# Patient Record
Sex: Male | Born: 1980 | Race: White | Hispanic: No | Marital: Single | Smoking: Current some day smoker
Health system: Southern US, Community
[De-identification: ages and names within clinical notes are randomized; demographics above are authoritative.]

## PROBLEM LIST (undated history)

## (undated) DIAGNOSIS — F32A Depression, unspecified: Secondary | ICD-10-CM

## (undated) DIAGNOSIS — F329 Major depressive disorder, single episode, unspecified: Secondary | ICD-10-CM

---
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
## 2019-04-05 ENCOUNTER — Encounter (HOSPITAL_COMMUNITY): Payer: Self-pay

## 2019-04-05 ENCOUNTER — Observation Stay (HOSPITAL_COMMUNITY)
Admission: EM | Admit: 2019-04-05 | Discharge: 2019-04-06 | Disposition: A | Discharge status: Home / Self Care | Payer: BC Managed Care – PPO | Attending: Surgery | Admitting: Surgery

## 2019-04-05 ENCOUNTER — Observation Stay (HOSPITAL_COMMUNITY): Payer: BC Managed Care – PPO | Admitting: Anesthesiology

## 2019-04-05 ENCOUNTER — Encounter (HOSPITAL_COMMUNITY)
Admission: EM | Disposition: A | Discharge status: Home / Self Care | Payer: Self-pay | Source: Home / Self Care | Attending: Emergency Medicine

## 2019-04-05 ENCOUNTER — Emergency Department (HOSPITAL_COMMUNITY): Payer: BC Managed Care – PPO

## 2019-04-05 ENCOUNTER — Other Ambulatory Visit: Payer: Self-pay

## 2019-04-05 DIAGNOSIS — K429 Umbilical hernia without obstruction or gangrene: Secondary | ICD-10-CM | POA: Diagnosis not present

## 2019-04-05 DIAGNOSIS — Z23 Encounter for immunization: Secondary | ICD-10-CM | POA: Insufficient documentation

## 2019-04-05 DIAGNOSIS — F17200 Nicotine dependence, unspecified, uncomplicated: Secondary | ICD-10-CM | POA: Insufficient documentation

## 2019-04-05 DIAGNOSIS — K3589 Other acute appendicitis without perforation or gangrene: Secondary | ICD-10-CM | POA: Diagnosis present

## 2019-04-05 DIAGNOSIS — Z6835 Body mass index (BMI) 35.0-35.9, adult: Secondary | ICD-10-CM | POA: Diagnosis not present

## 2019-04-05 DIAGNOSIS — Z79899 Other long term (current) drug therapy: Secondary | ICD-10-CM | POA: Insufficient documentation

## 2019-04-05 DIAGNOSIS — K3580 Unspecified acute appendicitis: Secondary | ICD-10-CM | POA: Diagnosis not present

## 2019-04-05 DIAGNOSIS — N200 Calculus of kidney: Secondary | ICD-10-CM | POA: Insufficient documentation

## 2019-04-05 DIAGNOSIS — F419 Anxiety disorder, unspecified: Secondary | ICD-10-CM | POA: Insufficient documentation

## 2019-04-05 DIAGNOSIS — Z8719 Personal history of other diseases of the digestive system: Secondary | ICD-10-CM | POA: Diagnosis present

## 2019-04-05 HISTORY — PX: LAPAROSCOPIC APPENDECTOMY: SHX408

## 2019-04-05 LAB — URINALYSIS, ROUTINE W REFLEX MICROSCOPIC
Bilirubin Urine: NEGATIVE
Glucose, UA: 50 mg/dL — AB
Hgb urine dipstick: NEGATIVE
Ketones, ur: 20 mg/dL — AB
Leukocytes,Ua: NEGATIVE
Nitrite: NEGATIVE
Protein, ur: NEGATIVE mg/dL
Specific Gravity, Urine: 1.014 (ref 1.005–1.030)
pH: 5 (ref 5.0–8.0)

## 2019-04-05 LAB — COMPREHENSIVE METABOLIC PANEL
ALT: 17 U/L (ref 0–44)
AST: 13 U/L — ABNORMAL LOW (ref 15–41)
Albumin: 4.2 g/dL (ref 3.5–5.0)
Alkaline Phosphatase: 46 U/L (ref 38–126)
Anion gap: 11 (ref 5–15)
BUN: 10 mg/dL (ref 6–20)
CO2: 23 mmol/L (ref 22–32)
Calcium: 9.1 mg/dL (ref 8.9–10.3)
Chloride: 100 mmol/L (ref 98–111)
Creatinine, Ser: 0.95 mg/dL (ref 0.61–1.24)
GFR calc Af Amer: >60 mL/min (ref >60)
GFR calc non Af Amer: >60 mL/min (ref >60)
Glucose, Bld: 175 mg/dL — ABNORMAL HIGH (ref 70–99)
Potassium: 3.8 mmol/L (ref 3.5–5.1)
Sodium: 134 mmol/L — ABNORMAL LOW (ref 135–145)
Total Bilirubin: 1.1 mg/dL (ref 0.3–1.2)
Total Protein: 7.4 g/dL (ref 6.5–8.1)

## 2019-04-05 LAB — CBC
HCT: 45.4 % (ref 39.0–52.0)
Hemoglobin: 16.1 g/dL (ref 13.0–17.0)
MCH: 29.5 pg (ref 26.0–34.0)
MCHC: 35.5 g/dL (ref 30.0–36.0)
MCV: 83.3 fL (ref 80.0–100.0)
Platelets: 249 10*3/uL (ref 150–400)
RBC: 5.45 MIL/uL (ref 4.22–5.81)
RDW: 12.2 % (ref 11.5–15.5)
WBC: 18.9 10*3/uL — ABNORMAL HIGH (ref 4.0–10.5)
nRBC: 0 % (ref 0.0–0.2)

## 2019-04-05 LAB — LIPASE, BLOOD: Lipase: 22 U/L (ref 11–51)

## 2019-04-05 LAB — SARS CORONAVIRUS 2 BY RT PCR (HOSPITAL ORDER, PERFORMED IN ~~LOC~~ HOSPITAL LAB): SARS Coronavirus 2: NEGATIVE

## 2019-04-05 SURGERY — APPENDECTOMY, LAPAROSCOPIC
Anesthesia: General | Site: Abdomen

## 2019-04-05 MED ORDER — KCL IN DEXTROSE-NACL 20-5-0.45 MEQ/L-%-% IV SOLN
INTRAVENOUS | Status: DC
  Administered 2019-04-05 – 2019-04-06 (×2): via INTRAVENOUS
  Filled 2019-04-05 (×2): qty 1000

## 2019-04-05 MED ORDER — ONDANSETRON HCL 4 MG/2ML IJ SOLN
INTRAMUSCULAR | Status: AC
  Filled 2019-04-05: qty 2

## 2019-04-05 MED ORDER — ONDANSETRON HCL 4 MG/2ML IJ SOLN: 4.0000 mg | Freq: Four times a day (QID) | INTRAMUSCULAR | Status: DC | PRN

## 2019-04-05 MED ORDER — METRONIDAZOLE IN NACL 5-0.79 MG/ML-% IV SOLN
500.0000 mg | Freq: Three times a day (TID) | INTRAVENOUS | Status: DC
  Administered 2019-04-05 – 2019-04-06 (×3): 500 mg via INTRAVENOUS
  Filled 2019-04-05 (×3): qty 100

## 2019-04-05 MED ORDER — DEXAMETHASONE SODIUM PHOSPHATE 10 MG/ML IJ SOLN
INTRAMUSCULAR | Status: AC
  Filled 2019-04-05: qty 1

## 2019-04-05 MED ORDER — ROCURONIUM BROMIDE 10 MG/ML (PF) SYRINGE
PREFILLED_SYRINGE | INTRAVENOUS | Status: AC
  Filled 2019-04-05: qty 10

## 2019-04-05 MED ORDER — DICYCLOMINE HCL 10 MG PO CAPS: 20.0000 mg | ORAL_CAPSULE | Freq: Once | ORAL | Status: DC

## 2019-04-05 MED ORDER — PROPOFOL 10 MG/ML IV BOLUS
INTRAVENOUS | Status: AC
  Filled 2019-04-05: qty 20

## 2019-04-05 MED ORDER — ROCURONIUM BROMIDE 100 MG/10ML IV SOLN
INTRAVENOUS | Status: DC | PRN
  Administered 2019-04-05: 50 mg via INTRAVENOUS

## 2019-04-05 MED ORDER — PROPOFOL 10 MG/ML IV BOLUS
INTRAVENOUS | Status: DC | PRN
  Administered 2019-04-05: 300 mg via INTRAVENOUS

## 2019-04-05 MED ORDER — ONDANSETRON HCL 4 MG/2ML IJ SOLN
4.0000 mg | Freq: Once | INTRAMUSCULAR | Status: AC
  Administered 2019-04-05: 4 mg via INTRAVENOUS
  Filled 2019-04-05: qty 2

## 2019-04-05 MED ORDER — METRONIDAZOLE IN NACL 5-0.79 MG/ML-% IV SOLN: 500.0000 mg | Freq: Three times a day (TID) | INTRAVENOUS | Status: DC

## 2019-04-05 MED ORDER — SODIUM CHLORIDE 0.9% FLUSH
3.0000 mL | Freq: Once | INTRAVENOUS | Status: AC
  Administered 2019-04-05: 07:00:00 3 mL via INTRAVENOUS

## 2019-04-05 MED ORDER — ACETAMINOPHEN 325 MG PO TABS: 650.0000 mg | ORAL_TABLET | Freq: Four times a day (QID) | ORAL | Status: DC | PRN

## 2019-04-05 MED ORDER — MEPERIDINE HCL 50 MG/ML IJ SOLN: 6.2500 mg | INTRAMUSCULAR | Status: DC | PRN

## 2019-04-05 MED ORDER — DEXAMETHASONE SODIUM PHOSPHATE 10 MG/ML IJ SOLN
INTRAMUSCULAR | Status: DC | PRN
  Administered 2019-04-05: 10 mg via INTRAVENOUS

## 2019-04-05 MED ORDER — ACETAMINOPHEN 650 MG RE SUPP: 650.0000 mg | Freq: Four times a day (QID) | RECTAL | Status: DC | PRN

## 2019-04-05 MED ORDER — MORPHINE SULFATE (PF) 4 MG/ML IV SOLN
4.0000 mg | Freq: Once | INTRAVENOUS | Status: AC
  Administered 2019-04-05: 04:00:00 4 mg via INTRAVENOUS
  Filled 2019-04-05: qty 1

## 2019-04-05 MED ORDER — HYDROMORPHONE HCL 1 MG/ML IJ SOLN: 1.0000 mg | INTRAMUSCULAR | Status: DC | PRN

## 2019-04-05 MED ORDER — ONDANSETRON 4 MG PO TBDP: 4.0000 mg | ORAL_TABLET | Freq: Four times a day (QID) | ORAL | Status: DC | PRN

## 2019-04-05 MED ORDER — SODIUM CHLORIDE 0.9 % IV SOLN
2.0000 g | Freq: Once | INTRAVENOUS | Status: AC
  Administered 2019-04-05: 2 g via INTRAVENOUS
  Filled 2019-04-05: qty 20

## 2019-04-05 MED ORDER — BUPIVACAINE-EPINEPHRINE 0.5% -1:200000 IJ SOLN
INTRAMUSCULAR | Status: DC | PRN
  Administered 2019-04-05: 20 mL

## 2019-04-05 MED ORDER — HYDROMORPHONE HCL 1 MG/ML IJ SOLN: 0.2500 mg | INTRAMUSCULAR | Status: DC | PRN

## 2019-04-05 MED ORDER — LACTATED RINGERS IV SOLN
INTRAVENOUS | Status: AC | PRN
  Administered 2019-04-05: 1000 mL

## 2019-04-05 MED ORDER — BUPIVACAINE-EPINEPHRINE 0.5% -1:200000 IJ SOLN
INTRAMUSCULAR | Status: AC
  Filled 2019-04-05: qty 1

## 2019-04-05 MED ORDER — FENTANYL CITRATE (PF) 250 MCG/5ML IJ SOLN
INTRAMUSCULAR | Status: AC
  Filled 2019-04-05: qty 5

## 2019-04-05 MED ORDER — TRAMADOL HCL 50 MG PO TABS: 50.0000 mg | ORAL_TABLET | Freq: Four times a day (QID) | ORAL | Status: DC | PRN

## 2019-04-05 MED ORDER — IOHEXOL 300 MG/ML  SOLN
100.0000 mL | Freq: Once | INTRAMUSCULAR | Status: AC | PRN
  Administered 2019-04-05: 05:00:00 100 mL via INTRAVENOUS

## 2019-04-05 MED ORDER — BUPROPION HCL ER (XL) 300 MG PO TB24
300.0000 mg | ORAL_TABLET | Freq: Every day | ORAL | Status: DC
  Administered 2019-04-05 – 2019-04-06 (×2): 300 mg via ORAL
  Filled 2019-04-05 (×2): qty 1

## 2019-04-05 MED ORDER — SODIUM CHLORIDE 0.9 % IV BOLUS
1000.0000 mL | Freq: Once | INTRAVENOUS | Status: AC
  Administered 2019-04-05: 1000 mL via INTRAVENOUS

## 2019-04-05 MED ORDER — SODIUM CHLORIDE 0.9 % IV SOLN
INTRAVENOUS | Status: DC | PRN
  Administered 2019-04-05: 13:00:00 via INTRAVENOUS

## 2019-04-05 MED ORDER — SUCCINYLCHOLINE CHLORIDE 20 MG/ML IJ SOLN
INTRAMUSCULAR | Status: DC | PRN
  Administered 2019-04-05: 140 mg via INTRAVENOUS

## 2019-04-05 MED ORDER — MIDAZOLAM HCL 2 MG/2ML IJ SOLN
INTRAMUSCULAR | Status: DC | PRN
  Administered 2019-04-05: 2 mg via INTRAVENOUS

## 2019-04-05 MED ORDER — PNEUMOCOCCAL VAC POLYVALENT 25 MCG/0.5ML IJ INJ
0.5000 mL | INJECTION | INTRAMUSCULAR | Status: AC
  Administered 2019-04-06: 0.5 mL via INTRAMUSCULAR
  Filled 2019-04-05: qty 0.5

## 2019-04-05 MED ORDER — LIDOCAINE 2% (20 MG/ML) 5 ML SYRINGE
INTRAMUSCULAR | Status: AC
  Filled 2019-04-05: qty 5

## 2019-04-05 MED ORDER — SODIUM CHLORIDE (PF) 0.9 % IJ SOLN
INTRAMUSCULAR | Status: AC
  Filled 2019-04-05: qty 50

## 2019-04-05 MED ORDER — SUCCINYLCHOLINE CHLORIDE 200 MG/10ML IV SOSY
PREFILLED_SYRINGE | INTRAVENOUS | Status: AC
  Filled 2019-04-05: qty 10

## 2019-04-05 MED ORDER — FENTANYL CITRATE (PF) 250 MCG/5ML IJ SOLN
INTRAMUSCULAR | Status: DC | PRN
  Administered 2019-04-05 (×5): 50 ug via INTRAVENOUS

## 2019-04-05 MED ORDER — SODIUM CHLORIDE 0.9 % IV SOLN
2.0000 g | INTRAVENOUS | Status: DC
  Administered 2019-04-06: 2 g via INTRAVENOUS
  Filled 2019-04-05: qty 2

## 2019-04-05 MED ORDER — SUGAMMADEX SODIUM 500 MG/5ML IV SOLN
INTRAVENOUS | Status: DC | PRN
  Administered 2019-04-05: 230 mg via INTRAVENOUS

## 2019-04-05 MED ORDER — METRONIDAZOLE IN NACL 5-0.79 MG/ML-% IV SOLN
500.0000 mg | Freq: Once | INTRAVENOUS | Status: AC
  Administered 2019-04-05: 500 mg via INTRAVENOUS
  Filled 2019-04-05: qty 100

## 2019-04-05 MED ORDER — KCL IN DEXTROSE-NACL 20-5-0.45 MEQ/L-%-% IV SOLN
INTRAVENOUS | Status: DC
  Administered 2019-04-05: 09:00:00 via INTRAVENOUS
  Filled 2019-04-05: qty 1000

## 2019-04-05 MED ORDER — SUGAMMADEX SODIUM 500 MG/5ML IV SOLN
INTRAVENOUS | Status: AC
  Filled 2019-04-05: qty 5

## 2019-04-05 MED ORDER — BUPROPION HCL ER (XL) 150 MG PO TB24
150.0000 mg | ORAL_TABLET | Freq: Every day | ORAL | Status: DC
  Administered 2019-04-05 – 2019-04-06 (×2): 150 mg via ORAL
  Filled 2019-04-05 (×2): qty 1

## 2019-04-05 MED ORDER — MIDAZOLAM HCL 2 MG/2ML IJ SOLN
INTRAMUSCULAR | Status: AC
  Filled 2019-04-05: qty 2

## 2019-04-05 MED ORDER — HYDROCODONE-ACETAMINOPHEN 5-325 MG PO TABS
1.0000 | ORAL_TABLET | ORAL | Status: DC | PRN
  Administered 2019-04-05 – 2019-04-06 (×2): 1 via ORAL
  Filled 2019-04-05 (×2): qty 1

## 2019-04-05 MED ORDER — LACTATED RINGERS IV SOLN
INTRAVENOUS | Status: DC | PRN
  Administered 2019-04-05: 13:00:00 via INTRAVENOUS

## 2019-04-05 MED ORDER — MIDAZOLAM HCL 2 MG/2ML IJ SOLN: 0.5000 mg | Freq: Once | INTRAMUSCULAR | Status: DC | PRN

## 2019-04-05 MED ORDER — ONDANSETRON HCL 4 MG/2ML IJ SOLN
INTRAMUSCULAR | Status: DC | PRN
  Administered 2019-04-05: 4 mg via INTRAVENOUS

## 2019-04-05 MED ORDER — LIDOCAINE HCL (CARDIAC) PF 100 MG/5ML IV SOSY
PREFILLED_SYRINGE | INTRAVENOUS | Status: DC | PRN
  Administered 2019-04-05: 40 mg via INTRATRACHEAL

## 2019-04-05 MED ORDER — 0.9 % SODIUM CHLORIDE (POUR BTL) OPTIME
TOPICAL | Status: DC | PRN
  Administered 2019-04-05: 14:00:00 1000 mL

## 2019-04-05 MED ORDER — PROMETHAZINE HCL 25 MG/ML IJ SOLN: 6.2500 mg | INTRAMUSCULAR | Status: DC | PRN

## 2019-04-05 MED ORDER — SODIUM CHLORIDE 0.9 % IV SOLN: 2.0000 g | INTRAVENOUS | Status: DC

## 2019-04-05 SURGICAL SUPPLY — 34 items
APPLIER CLIP ROT 10 11.4 M/L (STAPLE)
CHLORAPREP W/TINT 26 (MISCELLANEOUS) ×3 IMPLANT
CLIP APPLIE ROT 10 11.4 M/L (STAPLE) IMPLANT
CLOSURE WOUND 1/2 X4 (GAUZE/BANDAGES/DRESSINGS)
COVER SURGICAL LIGHT HANDLE (MISCELLANEOUS) ×3 IMPLANT
COVER WAND RF STERILE (DRAPES) IMPLANT
CUTTER FLEX LINEAR 45M (STAPLE) ×3 IMPLANT
DECANTER SPIKE VIAL GLASS SM (MISCELLANEOUS) ×3 IMPLANT
DERMABOND ADVANCED (GAUZE/BANDAGES/DRESSINGS) ×2
DERMABOND ADVANCED .7 DNX12 (GAUZE/BANDAGES/DRESSINGS) ×1 IMPLANT
DRAPE LAPAROSCOPIC ABDOMINAL (DRAPES) ×3 IMPLANT
ELECT REM PT RETURN 15FT ADLT (MISCELLANEOUS) ×3 IMPLANT
ENDOLOOP SUT PDS II  0 18 (SUTURE)
ENDOLOOP SUT PDS II 0 18 (SUTURE) IMPLANT
GLOVE SURG ORTHO 8.0 STRL STRW (GLOVE) ×3 IMPLANT
GOWN STRL REUS W/TWL XL LVL3 (GOWN DISPOSABLE) ×6 IMPLANT
KIT BASIN OR (CUSTOM PROCEDURE TRAY) ×3 IMPLANT
KIT TURNOVER KIT A (KITS) IMPLANT
POUCH SPECIMEN RETRIEVAL 10MM (ENDOMECHANICALS) ×3 IMPLANT
RELOAD 45 THICK GREEN (ENDOMECHANICALS) ×3 IMPLANT
RELOAD 45 VASCULAR/THIN (ENDOMECHANICALS) IMPLANT
RELOAD STAPLE TA45 3.5 REG BLU (ENDOMECHANICALS) IMPLANT
SET IRRIG TUBING LAPAROSCOPIC (IRRIGATION / IRRIGATOR) ×3 IMPLANT
SET TUBE SMOKE EVAC HIGH FLOW (TUBING) ×3 IMPLANT
SHEARS HARMONIC ACE PLUS 36CM (ENDOMECHANICALS) ×3 IMPLANT
STRIP CLOSURE SKIN 1/2X4 (GAUZE/BANDAGES/DRESSINGS) IMPLANT
SUT MNCRL AB 4-0 PS2 18 (SUTURE) ×3 IMPLANT
TOWEL OR 17X26 10 PK STRL BLUE (TOWEL DISPOSABLE) ×3 IMPLANT
TOWEL OR NON WOVEN STRL DISP B (DISPOSABLE) ×3 IMPLANT
TRAY FOLEY MTR SLVR 14FR STAT (SET/KITS/TRAYS/PACK) IMPLANT
TRAY FOLEY MTR SLVR 16FR STAT (SET/KITS/TRAYS/PACK) IMPLANT
TRAY LAPAROSCOPIC (CUSTOM PROCEDURE TRAY) ×3 IMPLANT
TROCAR XCEL BLUNT TIP 100MML (ENDOMECHANICALS) ×3 IMPLANT
TROCAR XCEL NON-BLD 11X100MML (ENDOMECHANICALS) ×3 IMPLANT

## 2019-04-05 NOTE — ED Notes (Signed)
This writer will initiate fluids once flagyl administration is complete.

## 2019-04-05 NOTE — H&P (Signed)
Samuel Bean Current is an 38 y.o. male.    [facility]  Chief Complaint: abdominal pain, acute appendicitis  HPI: Patient is a 38 year old male who presents to the emergency department with onset of generalized abdominal pain approximately noon on April 04, 2019.  Pain became more severe.  He developed nausea and emesis.  He presented to the emergency department was found to have an elevated white blood cell count.  Pain localized to the right lower quadrant.  CT scan of the abdomen and pelvis confirmed acute appendicitis in a retrocecal location.  General surgery is now called for management.  Patient has had a right inguinal hernia repair in the past.  He takes Wellbutrin for anxiety.  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  History reviewed. No pertinent past medical history.  History reviewed. No pertinent surgical history.  History reviewed. No pertinent family history. Social History:  has no history on file for tobacco, alcohol, and drug.  Allergies: No Known Allergies  (Not in a hospital admission)   Results for orders placed or performed during the hospital encounter of 04/05/19 (from the past 48 hour(s))  Urinalysis, Routine w reflex microscopic     Status: Abnormal   Collection Time: 04/05/19  1:25 AM  Result Value Ref Range   Color, Urine YELLOW YELLOW   APPearance CLEAR CLEAR   Specific Gravity, Urine 1.014 1.005 - 1.030   pH 5.0 5.0 - 8.0   Glucose, UA 50 (A) NEGATIVE mg/dL   Hgb urine dipstick NEGATIVE NEGATIVE   Bilirubin Urine NEGATIVE NEGATIVE   Ketones, ur 20 (A) NEGATIVE mg/dL   Protein, ur NEGATIVE NEGATIVE mg/dL   Nitrite NEGATIVE NEGATIVE   Leukocytes,Ua NEGATIVE NEGATIVE    Comment: Performed at [facility], [address] [place], [place] [address]  CBC     Status: Abnormal   Collection Time: 04/05/19  3:15 AM  Result Value Ref Range   WBC 18.9 (H) 4.0 - 10.5 K/uL   RBC  5.45 4.22 - 5.81 MIL/uL   Hemoglobin 16.1 13.0 - 17.0 g/dL   HCT 60.445.4 54.039.0 - 98.152.0 %   MCV 83.3 80.0 - 100.0 fL   MCH 29.5 26.0 - 34.0 pg   MCHC 35.5 30.0 - 36.0 g/dL   RDW 19.112.2 47.811.5 - 29.515.5 %   Platelets 249 150 - 400 K/uL   nRBC 0.0 0.0 - 0.2 %    Comment: Performed at [facility], [address] [place], [place] [address]  Comprehensive metabolic panel     Status: Abnormal   Collection Time: 04/05/19  3:45 AM  Result Value Ref Range   Sodium 134 (L) 135 - 145 mmol/L   Potassium 3.8 3.5 - 5.1 mmol/L   Chloride 100 98 - 111 mmol/L   CO2 23 22 - 32 mmol/L   Glucose, Bld 175 (H) 70 - 99 mg/dL   BUN 10 6 - 20 mg/dL   Creatinine, Ser 8.650.95 0.61 - 1.24 mg/dL   Calcium 9.1 8.9 - 78.410.3 mg/dL   Total Protein 7.4 6.5 - 8.1 g/dL   Albumin 4.2 3.5 - 5.0 g/dL   AST 13 (L) 15 - 41 U/L   ALT 17 0 - 44 U/L   Alkaline Phosphatase 46 38 - 126 U/L   Total Bilirubin 1.1 0.3 - 1.2 mg/dL   GFR calc non Af Amer >60 >60 mL/min   GFR calc Af Amer >60 >60 mL/min  Anion gap 11 5 - 15    Comment: Performed at [facility], [address] [place], [place] [address]  Lipase, blood     Status: None   Collection Time: 04/05/19  3:45 AM  Result Value Ref Range   Lipase 22 11 - 51 U/L    Comment: Performed at [facility], [address] [place], [place] [address]   Ct Abdomen Pelvis W Contrast  Result Date: 04/05/2019 CLINICAL DATA:  Initial evaluation for acute right lower quadrant abdominal pain. EXAM: CT ABDOMEN AND PELVIS WITH CONTRAST TECHNIQUE: Multidetector CT imaging of the abdomen and pelvis was performed using the standard protocol following bolus administration of intravenous contrast. CONTRAST:  OMNIPAQUE IOHEXOL 300 MG/ML  SOLN COMPARISON:  None available. FINDINGS: Lower chest: Visualized lung bases are clear. Hepatobiliary: Liver demonstrates a normal contrast enhanced appearance. Gallbladder within normal limits. No biliary  dilatation. Pancreas: Pancreas within normal limits. Spleen: Spleen within normal limits. Adrenals/Urinary Tract: Adrenal glands are normal. Kidneys equal in size with symmetric enhancement. 4 mm nonobstructive stone present at the lower pole of the left kidney. No other radiopaque calculi. No hydronephrosis or focal enhancing renal mass. No hydroureter. Bladder within normal limits. Stomach/Bowel: Stomach within normal limits. No evidence for bowel obstruction. Appendix: Location: Appendix retrocecal in location, position within the right lower quadrant (series 2, image 65). Diameter: 15 mm Appendicolith: Negative. Mucosal hyper-enhancement: Prominent mucosal enhancement. Extraluminal gas: No extraluminal gas to suggest perforation. Periappendiceal collection: Prominent periappendiceal fat stranding/phlegmonous change. No discrete periappendiceal abscess or other collection. Vascular/Lymphatic: Normal intravascular enhancement seen throughout the intra-abdominal aorta. Mesenteric vessels patent proximally. No adenopathy. Reproductive: Prostate within normal limits. Other: No free air or fluid. Small fat containing paraumbilical hernia noted without associated inflammation. Musculoskeletal: No acute osseous abnormality. No discrete lytic or blastic osseous lesions. IMPRESSION: 1. Findings consistent with acute appendicitis. No evidence for perforation or other complication. 2. 4 mm nonobstructive left renal nephrolithiasis. Electronically Signed   By: Rise Mu M.D.   On: 04/05/2019 05:40    Review of Systems  Constitutional: Positive for chills and fever. Negative for diaphoresis.  HENT: Negative.   Eyes: Negative.   Respiratory: Negative.   Cardiovascular: Negative.   Gastrointestinal: Positive for abdominal pain, nausea and vomiting. Negative for constipation and diarrhea.  Genitourinary: Negative.   Musculoskeletal: Negative.   Skin: Negative.   Neurological: Negative.    Endo/Heme/Allergies: Negative.   Psychiatric/Behavioral: The patient is nervous/anxious.     Blood pressure (!) 143/102, pulse 88, temperature 99.2 F (37.3 C), temperature source Oral, resp. rate 16, SpO2 98 %. Physical Exam  Constitutional: He is oriented to person, place, and time. He appears well-developed and well-nourished. No distress.  HENT:  Head: Normocephalic and atraumatic.  Right Ear: External ear normal.  Left Ear: External ear normal.  Eyes: Pupils are equal, round, and reactive to light. Conjunctivae are normal. No scleral icterus.  Neck: Normal range of motion. Neck supple. No tracheal deviation present. No thyromegaly present.  Cardiovascular: Normal rate, regular rhythm and normal heart sounds.  No murmur heard. Respiratory: Effort normal and breath sounds normal. No respiratory distress. He has no wheezes.  GI: Soft. Bowel sounds are normal. He exhibits no distension and no mass. There is abdominal tenderness (RLQ). There is guarding. There is no rebound.  Musculoskeletal: Normal range of motion.        General: No deformity or edema.  Neurological: He is alert and oriented to person, place, and  time.  Skin: Skin is warm and dry. He is not diaphoretic.  Psychiatric: He has a normal mood and affect. His behavior is normal.     Assessment/Plan Acute appendicitis, retrocecal  Admit to general surgery service  IV abx's  Rx for pain prn  Plan OR this AM for lap appendectomy  The risks and benefits of the procedure have been discussed at length with the patient.  The patient understands the proposed procedure, potential alternative treatments, and the course of recovery to be expected.  All of the patient's questions have been answered at this time.  The patient wishes to proceed with surgery.  Armandina Gemma, [facility]: [place], MD 04/05/2019, 7:01 AM

## 2019-04-05 NOTE — Anesthesia Preprocedure Evaluation (Addendum)
Anesthesia Evaluation  Patient identified by MRN, date of birth, ID band Patient awake    Reviewed: Allergy & Precautions, NPO status , Patient's Chart, lab work & pertinent test results  History of Anesthesia Complications Negative for: history of anesthetic complications  Airway Mallampati: I  TM Distance: >3 FB Neck ROM: Full    Dental  (+) Teeth Intact, Dental Advisory Given   Pulmonary Current Smoker and Patient abstained from smoking.,  04/05/2019 SARS coronavirus NEG   Pulmonary exam normal breath sounds clear to auscultation       Cardiovascular negative cardio ROS Normal cardiovascular exam Rhythm:Regular Rate:Normal     Neuro/Psych negative neurological ROS     GI/Hepatic Neg liver ROS, Acute appy: N/V   Endo/Other  Morbid obesity  Renal/GU negative Renal ROS     Musculoskeletal   Abdominal (+) + obese,   Peds  Hematology negative hematology ROS (+)   Anesthesia Other Findings   Reproductive/Obstetrics                           Anesthesia Physical Anesthesia Plan  ASA: II  Anesthesia Plan:    Post-op Pain Management:    Induction: Intravenous, Rapid sequence and Cricoid pressure planned  PONV Risk Score and Plan: 0 and Ondansetron and Dexamethasone  Airway Management Planned: Oral ETT  Additional Equipment:   Intra-op Plan:   Post-operative Plan: Extubation in OR  Informed Consent: I have reviewed the patients History and Physical, chart, labs and discussed the procedure including the risks, benefits and alternatives for the proposed anesthesia with the patient or authorized representative who has indicated his/her understanding and acceptance.     Dental advisory given  Plan Discussed with: CRNA and Surgeon  Anesthesia Plan Comments:         Anesthesia Quick Evaluation

## 2019-04-05 NOTE — ED Provider Notes (Signed)
[facility]-EMERGENCY DEPT Provider Note   CSN: [id number] Arrival date & time: 04/05/19  0101     History   Chief Complaint Chief Complaint  Patient presents with   Emesis    HPI Samuel Bean is a 38 y.o. male.     The history is provided by the patient and medical records.  Emesis Associated symptoms: abdominal pain      38 year old male presenting to the ED with abdominal pain.  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  States it has been a constant, dull, aching pain across his lower abdomen, worse on the right side.  He reports multiple episodes of nonbloody, nonbilious emesis.  He denies any diarrhea.  He denies any abnormal food intake, has eaten some salads and a chop steak but all of it tasted fine.  He has eaten all of these foods before without issue.  He did try taking some Pepto-Bismol at home without relief.  History reviewed. No pertinent past medical history.  There are no active problems to display for this patient.   History reviewed. No pertinent surgical history.      Home Medications    Prior to Admission medications   Not on File    Family History History reviewed. No pertinent family history.  Social History Social History   Tobacco Use   Smoking status: Not on file  Substance Use Topics   Alcohol use: Not on file   Drug use: Not on file     Allergies   Patient has no known allergies.   Review of Systems Review of Systems  Gastrointestinal: Positive for abdominal pain, nausea and vomiting.  All other systems reviewed and are negative.    Physical Exam Updated Vital Signs BP (!) 143/102 (BP Location: Left Arm)    Pulse 88    Temp 99.2 F (37.3 C) (Oral)    Resp 16    SpO2 98%   Physical Exam Vitals signs and nursing note reviewed.  Constitutional:      Appearance: He is well-developed.  HENT:     Head:  Normocephalic and atraumatic.  Eyes:     Conjunctiva/sclera: Conjunctivae normal.     Pupils: Pupils are equal, round, and reactive to light.  Neck:     Musculoskeletal: Normal range of motion.  Cardiovascular:     Rate and Rhythm: Normal rate and regular rhythm.     Heart sounds: Normal heart sounds.  Pulmonary:     Effort: Pulmonary effort is normal.     Breath sounds: Normal breath sounds.  Abdominal:     General: Bowel sounds are normal.     Palpations: Abdomen is soft.     Tenderness: There is abdominal tenderness in the right lower quadrant.     Comments: Obese abdomen  Musculoskeletal: Normal range of motion.  Skin:    General: Skin is warm and dry.  Neurological:     Mental Status: He is alert and oriented to person, place, and time.      ED Treatments / Results  Labs (all labs ordered are listed, but only abnormal results are displayed) Labs Reviewed  CBC - Abnormal; Notable for the following components:      Result Value   WBC 18.9 (*)    All other components within normal limits  URINALYSIS, ROUTINE W REFLEX MICROSCOPIC - Abnormal; Notable for the following components:   Glucose, UA  50 (*)    Ketones, ur 20 (*)    All other components within normal limits  COMPREHENSIVE METABOLIC PANEL - Abnormal; Notable for the following components:   Sodium 134 (*)    Glucose, Bld 175 (*)    AST 13 (*)    All other components within normal limits  SARS CORONAVIRUS 2 (HOSPITAL ORDER, [facility])  LIPASE, BLOOD    EKG None  Radiology Ct Abdomen Pelvis W Contrast  Result Date: 04/05/2019 CLINICAL DATA:  Initial evaluation for acute right lower quadrant abdominal pain. EXAM: CT ABDOMEN AND PELVIS WITH CONTRAST TECHNIQUE: Multidetector CT imaging of the abdomen and pelvis was performed using the standard protocol following bolus administration of intravenous contrast. CONTRAST:  154mL OMNIPAQUE IOHEXOL 300 MG/ML  SOLN COMPARISON:  None available.  FINDINGS: Lower chest: Visualized lung bases are clear. Hepatobiliary: Liver demonstrates a normal contrast enhanced appearance. Gallbladder within normal limits. No biliary dilatation. Pancreas: Pancreas within normal limits. Spleen: Spleen within normal limits. Adrenals/Urinary Tract: Adrenal glands are normal. Kidneys equal in size with symmetric enhancement. 4 mm nonobstructive stone present at the lower pole of the left kidney. No other radiopaque calculi. No hydronephrosis or focal enhancing renal mass. No hydroureter. Bladder within normal limits. Stomach/Bowel: Stomach within normal limits. No evidence for bowel obstruction. Appendix: Location: Appendix retrocecal in location, position within the right lower quadrant (series 2, image 65). Diameter: 15 mm Appendicolith: Negative. Mucosal hyper-enhancement: Prominent mucosal enhancement. Extraluminal gas: No extraluminal gas to suggest perforation. Periappendiceal collection: Prominent periappendiceal fat stranding/phlegmonous change. No discrete periappendiceal abscess or other collection. Vascular/Lymphatic: Normal intravascular enhancement seen throughout the intra-abdominal aorta. Mesenteric vessels patent proximally. No adenopathy. Reproductive: Prostate within normal limits. Other: No free air or fluid. Small fat containing paraumbilical hernia noted without associated inflammation. Musculoskeletal: No acute osseous abnormality. No discrete lytic or blastic osseous lesions. IMPRESSION: 1. Findings consistent with acute appendicitis. No evidence for perforation or other complication. 2. 4 mm nonobstructive left renal nephrolithiasis. Electronically Signed   By: Jeannine Boga M.D.   On: 04/05/2019 05:40    Procedures Procedures (including critical care time)  Medications Ordered in ED Medications  sodium chloride flush (NS) 0.9 % injection 3 mL (has no administration in time range)  sodium chloride (PF) 0.9 % injection (has no  administration in time range)  cefTRIAXone (ROCEPHIN) 2 g in sodium chloride 0.9 % 100 mL IVPB (2 g Intravenous New Bag/Given 04/05/19 0604)    And  metroNIDAZOLE (FLAGYL) IVPB 500 mg (has no administration in time range)  sodium chloride 0.9 % bolus 1,000 mL (1,000 mLs Intravenous New Bag/Given 04/05/19 0357)  ondansetron (ZOFRAN) injection 4 mg (4 mg Intravenous Given 04/05/19 0357)  morphine 4 MG/ML injection 4 mg (4 mg Intravenous Given 04/05/19 0356)  iohexol (OMNIPAQUE) 300 MG/ML solution 100 mL (100 mLs Intravenous Contrast Given 04/05/19 0448)     Initial Impression / Assessment and Plan / ED Course  I have reviewed the triage vital signs and the nursing notes.  Pertinent labs & imaging results that were available during my care of the patient were reviewed by me and considered in my medical decision making (see chart for details).  38 year old male here with abdominal pain, nausea, and vomiting.  This all began yesterday around noon and has been persistent since that time.  Pain localized across lower abdomen, worse on right side.  He is afebrile and nontoxic in appearance but does have some tenderness in the right lower quadrant.  Initial labs with white count of 18.9, normal electrolytes.  We will plan for CT scan.  He is given IV fluids, pain and nausea medications.  Will monitor.  5:48 AM CT with acute appendicitis.  No abscess or perforation.  Results discussed with patient, he is pain free now.  Will give  IV Rocephin and flagyl, obtain COVID swab.  General surgery consulted.  Discussed with Dr. Gerrit Friends-- will evaluate this morning and admit for ongoing care.  Final Clinical Impressions(s) / ED Diagnoses   Final diagnoses:  Other acute appendicitis    ED Discharge Orders    None       Garlon Hatchet, PA-C 04/05/19 0617    Zadie Rhine, MD 04/05/19 [phone]

## 2019-04-05 NOTE — ED Notes (Signed)
Patient transported to CT 

## 2019-04-05 NOTE — Transfer of Care (Signed)
Immediate Anesthesia Transfer of Care Note  Patient: Abdon Petrosky  Procedure(s) Performed: Procedure(s): APPENDECTOMY LAPAROSCOPIC (N/A)  Patient Location: PACU  Anesthesia Type:General  Level of Consciousness:  sedated, patient cooperative and responds to stimulation  Airway & Oxygen Therapy:Patient Spontanous Breathing and Patient connected to face mask oxgen  Post-op Assessment:  Report given to PACU RN and Post -op Vital signs reviewed and stable  Post vital signs:  Reviewed and stable  Last Vitals:  Vitals:   04/05/19 0117 04/05/19 0729  BP: (!) 143/102 137/79  Pulse: 88 87  Resp: 16 15  Temp: 37.3 C 36.8 C  SpO2: 87% 56%    Complications: No apparent anesthesia complications

## 2019-04-05 NOTE — Anesthesia Procedure Notes (Addendum)
Procedure Name: Intubation Date/Time: 04/05/2019 12:56 PM Performed by: Anne Fu, CRNA Pre-anesthesia Checklist: Patient identified, Emergency Drugs available, Suction available, Patient being monitored and Timeout performed Patient Re-evaluated:Patient Re-evaluated prior to induction Oxygen Delivery Method: Circle system utilized Preoxygenation: Pre-oxygenation with 100% oxygen Induction Type: IV induction, Cricoid Pressure applied and Rapid sequence Laryngoscope Size: Mac and 4 Grade View: Grade I Tube type: Oral Tube size: 7.5 mm Number of attempts: 1 Airway Equipment and Method: Stylet Placement Confirmation: ETT inserted through vocal cords under direct vision,  positive ETCO2 and breath sounds checked- equal and bilateral Secured at: 21 cm Tube secured with: Tape Dental Injury: Teeth and Oropharynx as per pre-operative assessment

## 2019-04-05 NOTE — Anesthesia Postprocedure Evaluation (Signed)
Anesthesia Post Note  Patient: Samuel Bean  Procedure(s) Performed: APPENDECTOMY LAPAROSCOPIC (N/A Abdomen)     Patient location during evaluation: PACU Anesthesia Type: General Level of consciousness: awake and alert, oriented and patient cooperative Pain management: pain level controlled Vital Signs Assessment: post-procedure vital signs reviewed and stable Respiratory status: spontaneous breathing, nonlabored ventilation and respiratory function stable Cardiovascular status: blood pressure returned to baseline and stable Postop Assessment: no apparent nausea or vomiting Anesthetic complications: no    Last Vitals:  Vitals:   04/05/19 1415 04/05/19 1430  BP: (!) 154/102 (!) (P) 159/88  Pulse: 88   Resp: 15   Temp:    SpO2: 100% (P) 100%    Last Pain:  Vitals:   04/05/19 1412  TempSrc:   PainSc: Asleep                 Sharonda Llamas,E. Lisvet Rasheed

## 2019-04-05 NOTE — ED Notes (Signed)
Pt. Documented in error see above note in chart. 

## 2019-04-05 NOTE — ED Notes (Signed)
Pt. Documented in error see note above in chart. 

## 2019-04-05 NOTE — ED Triage Notes (Addendum)
Pt reports several episodes of emesis since noon. He endorses generalized abdominal pain. Denies diarrhea, SOB, chest pain or cough. Denies sick contacts.

## 2019-04-05 NOTE — ED Notes (Signed)
Patient has been given CHG wipes to complete bath before surgery.

## 2019-04-05 NOTE — Op Note (Signed)
OPERATIVE REPORT - LAPAROSCOPIC APPENDECTOMY  Preop diagnosis:  Acute appendicitis  Postop diagnosis:  same  Procedure:  Laparoscopic appendectomy  Surgeon:  Armandina Gemma, MD  Anesthesia:  general endotracheal  Estimated blood loss:  minimal  Preparation:  Chlora-prep  Complications:  none  Indications:  Patient is a 38 year old male who presents to the emergency department with onset of generalized abdominal pain approximately noon on April 04, 2019.  Pain became more severe.  He developed nausea and emesis.  He presented to the emergency department was found to have an elevated white blood cell count.  Pain localized to the right lower quadrant.  CT scan of the abdomen and pelvis confirmed acute appendicitis in a retrocecal location.  General surgery is now called for management.  Procedure:  Patient is brought to the operating room and placed in a supine position on the operating room table. Following administration of general anesthesia, a time out was held and the patient's name and procedure is confirmed. Patient is then prepped and draped in the usual strict aseptic fashion.  After ascertaining that an adequate level of anesthesia has been achieved, a peri-umbilical incision is made with a #15 blade. Dissection is carried down to the fascia. Fascia is incised in the midline and the peritoneal cavity is entered cautiously. A #0-vicryl pursestring suture is placed in the fascia. An Hassan cannula is introduced under direct vision and secured with the pursestring suture. The abdomen is insufflated with carbon dioxide. The laparoscope is introduced and the abdomen is explored. Operative ports are placed in the right upper quadrant and left lower quadrant. The appendix is identified in a retrocecal location as suggested on the CT scan. The mesoappendix is divided with the harmonic scalpel. Dissection is carried down to the base of the appendix. The base of the appendix is dissected out  clearing the junction with the cecal wall. Using an Endo-GIA stapler, the base of the appendix is transected at the junction with the cecal wall. There is good approximation of tissue along the staple line. There is good hemostasis along the staple line. The appendix is placed into an endo-catch bag and withdrawn through the umbilical port. The #0-vicryl pursestring suture is tied securely.  Right lower quadrant is irrigated with warm saline which is evacuated. Good hemostasis is noted. Ports are removed under direct vision. Good hemostasis is noted at the port sites. Pneumoperitoneum is released.  Skin incisions are anesthetized with local anesthetic. Wounds are closed with interrupted 4-0 Monocryl subcuticular sutures. Wounds are washed and dried and Dermabond was applied. The patient is awakened from anesthesia and brought to the recovery room. The patient tolerated the procedure well.  Armandina Gemma, MD [facility] Office: [phone]

## 2019-04-05 NOTE — ED Notes (Signed)
Informed consent is signed and at bedside.

## 2019-04-05 NOTE — Plan of Care (Signed)
  Problem: Education: Goal: Verbalization of understanding the information provided will improve Outcome: Completed/Met   Problem: Bowel/Gastric: Goal: Gastrointestinal status for postoperative course will improve Outcome: Completed/Met   Problem: Physical Regulation: Goal: Postoperative complications will be avoided or minimized Outcome: Completed/Met   Problem: Respiratory: Goal: Respiratory status will improve Outcome: Completed/Met   Problem: Skin Integrity: Goal: Demonstration of wound healing without infection will improve Outcome: Completed/Met

## 2019-04-06 ENCOUNTER — Encounter (HOSPITAL_COMMUNITY): Payer: Self-pay | Admitting: Surgery

## 2019-04-06 LAB — HIV ANTIBODY (ROUTINE TESTING W REFLEX): HIV Screen 4th Generation wRfx: NONREACTIVE

## 2019-04-06 MED ORDER — TRAMADOL HCL 50 MG PO TABS: 50.0000 mg | ORAL_TABLET | Freq: Four times a day (QID) | ORAL | 0 refills | Status: AC | PRN

## 2019-04-06 NOTE — Progress Notes (Signed)
IV removed, site CDI. Pneumonia vaccine administered to Left Deltoid. AVS and new medication reviewed with patient, no questions or concerns. RN Jama Flavors pt downstairs to front lobby, pt sister to pick him up.

## 2019-04-06 NOTE — Discharge Summary (Signed)
Physician Discharge Summary [facility]  Patient ID: Samuel Bean MRN: [id number] DOB/AGE: 01/24/1981 38 y.o.  Admit date: 04/05/2019 Discharge date: 04/06/2019  Admission Diagnoses:  Acute appendicitis  Discharge Diagnoses:  Principal Problem:   Acute appendicitis   Discharged Condition: good  Hospital Course: Patient was admitted for observation following lap appendectomy.  Post op course was uncomplicated.  Pain was well controlled.  Tolerated diet.  Patient was prepared for discharge home on POD#1.  Consults: None  Treatments: surgery: lap appendectomy  Discharge Exam: Blood pressure 122/69, pulse 76, temperature 97.9 F (36.6 C), temperature source Oral, resp. rate 16, height 5\' 11"  (1.803 m), weight 115.2 kg, SpO2 97 %. HEENT - clear Neck - soft Chest - clear bilaterally Cor - RRR Abd - soft, obese; wounds dry and intact; BS present  Disposition: Home  Discharge Instructions    Diet - low sodium heart healthy   Complete by: As directed    Discharge instructions   Complete by: As directed    [facility]  LAPAROSCOPIC SURGERY:  POST-OP INSTRUCTIONS  Always review your discharge instruction sheet given to you by the facility where your surgery was performed.  A prescription for pain medication may be given to you upon discharge.  Take your pain medication as prescribed.  If narcotic pain medicine is not needed, then you may take acetaminophen (Tylenol) or ibuprofen (Advil) as needed.  Take your usually prescribed medications unless otherwise directed.  If you need a refill on your pain medication, please contact your pharmacy.  They will contact our office to request authorization. Prescriptions will not be filled after 5 P.M. or on weekends.  You should follow a light diet the first few days after arrival home, such as soup and crackers or toast.  Be sure to include plenty of fluids daily.  Most patients will experience  some swelling and bruising in the area of the incisions.  Ice packs will help.  Swelling and bruising can take several days to resolve.   It is common to experience some constipation after surgery.  Increasing fluid intake and taking a stool softener (such as Colace) will usually help or prevent this problem from occurring.  A mild laxative (Milk of Magnesia or Miralax) should be taken according to package instructions if there has been no bowel movement after 48 hours.  You will likely have Dermabond (topical glue) over your incisions.  This seals the incisions and allows you to bathe and shower at any time after your surgery.  Glue should remain in place for up to 10 days.  It may be removed after 10 days by pealing off the Dermabond material or using Vaseline or naval jelly to remove.  If you have steri-strips over your incisions, you may remove the gauze bandage on the second day after surgery, and you may shower at that time.  Leave your steri-strips (small skin tapes) in place directly over the incision.  These strips should remain on the skin for 5-7 days and then be removed.  You may get them wet in the shower and pat them dry.  Any sutures or staples will be removed at the office during your follow-up visit.  ACTIVITIES:  You may resume regular (light) daily activities beginning the next day - such as daily self-care, walking, climbing stairs - gradually increasing activities as tolerated.  You may have sexual intercourse when it is comfortable.  Refrain from any heavy lifting or straining until  approved by your doctor.  You may drive when you are no longer taking prescription pain medication, when you can comfortably wear a seatbelt, and when you can safely maneuver your car and apply brakes.  You should see your doctor in the office for a follow-up appointment approximately 2-3 weeks after your surgery.  Make sure that you call for this appointment within a day or two after you arrive home  to insure a convenient appointment time.  WHEN TO CALL YOUR DOCTOR: Fever over 101.0 Inability to urinate Continued bleeding from incision Increased pain, redness, or drainage from the incision Increasing abdominal pain  The clinic staff is available to answer your questions during regular business hours.  Please don't hesitate to call and ask to speak to one of the nurses for clinical concerns.  If you have a medical emergency, go to the nearest emergency room or call 911.  [facility] is always on call for the hospital.  Velora Hecklerodd M. Brookes Craine, MD, [facility] Office: [phone] Toll Free:  [phone] FAX [phone]  Website: [email or web address]   Increase activity slowly   Complete by: As directed    No dressing needed   Complete by: As directed      Allergies as of 04/06/2019   No Known Allergies     Medication List    TAKE these medications   buPROPion 150 MG 24 hr tablet Commonly known as: WELLBUTRIN XL Take 150 mg by mouth daily.   buPROPion 300 MG 24 hr tablet Commonly known as: WELLBUTRIN XL Take 300 mg by mouth daily.   ibuprofen 200 MG tablet Commonly known as: ADVIL Take 200 mg by mouth every 6 (six) hours as needed for moderate pain.   traMADol 50 MG tablet Commonly known as: ULTRAM Take 1-2 tablets (50-100 mg total) by mouth every 6 (six) hours as needed.      Follow-up Information    [facility] [place] Schedule an appointment as soon as possible for a visit in 2 week(s).   Specialty: General Surgery Contact information: [address] [place] [id number] [phone]          Velora Hecklerodd M. Kamille Toomey, MD, [facility] Office: [phone]   Signed: Darnell Levelodd Chip Canepa 04/06/2019, 9:55 AM

## 2019-05-19 ENCOUNTER — Other Ambulatory Visit: Payer: Self-pay

## 2019-05-19 DIAGNOSIS — Z20822 Contact with and (suspected) exposure to covid-19: Secondary | ICD-10-CM

## 2019-05-21 LAB — NOVEL CORONAVIRUS, NAA: SARS-CoV-2, NAA: NOT DETECTED

## 2020-03-02 ENCOUNTER — Other Ambulatory Visit: Payer: Self-pay

## 2020-03-02 ENCOUNTER — Encounter (HOSPITAL_COMMUNITY): Payer: Self-pay

## 2020-03-02 ENCOUNTER — Emergency Department (HOSPITAL_COMMUNITY): Payer: 59

## 2020-03-02 ENCOUNTER — Emergency Department (HOSPITAL_COMMUNITY)
Admission: EM | Admit: 2020-03-02 | Discharge: 2020-03-02 | Disposition: A | Discharge status: Home / Self Care | Payer: 59 | Attending: Emergency Medicine | Admitting: Emergency Medicine

## 2020-03-02 DIAGNOSIS — M25511 Pain in right shoulder: Secondary | ICD-10-CM | POA: Diagnosis present

## 2020-03-02 DIAGNOSIS — M62830 Muscle spasm of back: Secondary | ICD-10-CM | POA: Insufficient documentation

## 2020-03-02 DIAGNOSIS — F17210 Nicotine dependence, cigarettes, uncomplicated: Secondary | ICD-10-CM | POA: Diagnosis not present

## 2020-03-02 DIAGNOSIS — M62838 Other muscle spasm: Secondary | ICD-10-CM

## 2020-03-02 HISTORY — DX: Depression, unspecified: F32.A

## 2020-03-02 NOTE — ED Notes (Signed)
Pt discharged from this ED in stable condition at this time. All discharge instructions and follow up care reviewed with pt with no further questions at this time. Pt ambulatory with steady gait, clear speech.  

## 2020-03-02 NOTE — ED Provider Notes (Signed)
Samuel Bean   CSN: [id number] Arrival date & time: 03/02/20  0830     History Chief Complaint  Patient presents with  . Shoulder Pain    Samuel Bean is a 39 y.o. male.  HPI 39 year old male presents with right shoulder pain.  Started all of a sudden around midnight.  No acute injury, the only significant thing he did yesterday was mowing the lawn.  Noticed it went from his lateral shoulder to more of his right trapezius.  Took ibuprofen without much relief.  Took a hot shower with the water going straight to his trapezius and this did seem to help.  Went to urgent care when his pulse he came here.  He transiently had some tingling and numbness to his right pinky but this is gone.  No trauma. Currently no pain.   Past Medical History:  Diagnosis Date  . Depression     Patient Active Problem List   Diagnosis Date Noted  . Acute appendicitis 04/05/2019    Past Surgical History:  Procedure Laterality Date  . LAPAROSCOPIC APPENDECTOMY  04/05/2019  . LAPAROSCOPIC APPENDECTOMY N/A 04/05/2019   Procedure: APPENDECTOMY LAPAROSCOPIC;  Surgeon: Darnell Level, MD;  Location: [facility];  Service: General;  Laterality: N/A;       Family History  Problem Relation Age of Onset  . Cancer Mother   . Cancer Father   . Diabetes Father     Social History   Tobacco Use  . Smoking status: Current Some Day Smoker    Years: 8.00    Types: Cigarettes  . Smokeless tobacco: Never Used  Vaping Use  . Vaping Use: Never used  Substance Use Topics  . Alcohol use: Yes  . Drug use: Never    Home Medications Prior to Admission medications   Medication Sig Start Date End Date Taking? Authorizing Provider  buPROPion (WELLBUTRIN XL) 150 MG 24 hr tablet Take 150 mg by mouth daily.  04/01/19   [provider]  buPROPion (WELLBUTRIN XL) 300 MG 24 hr tablet Take 300 mg by mouth daily.  04/01/19   [provider]  ibuprofen (ADVIL) 200 MG  tablet Take 200 mg by mouth every 6 (six) hours as needed for moderate pain.    [provider]  traMADol (ULTRAM) 50 MG tablet Take 1-2 tablets (50-100 mg total) by mouth every 6 (six) hours as needed. 04/06/19   Darnell Level, MD    Allergies    Patient has no known allergies.  Review of Systems   Review of Systems  Constitutional: Negative for fever.  Musculoskeletal: Positive for myalgias.  Neurological: Negative for weakness.    Physical Exam Updated Vital Signs BP (!) 150/102 (BP Location: Left Arm)   Pulse 75   Temp 97.7 F (36.5 C) (Oral)   Resp 17   Ht 6' (1.829 m)   Wt 113.4 kg   SpO2 98%   BMI 33.91 kg/m   Physical Exam Vitals and nursing Bean reviewed.  Constitutional:      Appearance: He is well-developed.  HENT:     Head: Normocephalic and atraumatic.     Right Ear: External ear normal.     Left Ear: External ear normal.     Nose: Nose normal.  Eyes:     General:        Right eye: No discharge.        Left eye: No discharge.  Neck:   Cardiovascular:  Rate and Rhythm: Normal rate and regular rhythm.     Pulses:          Radial pulses are 2+ on the right side.     Heart sounds: Normal heart sounds.  Pulmonary:     Effort: Pulmonary effort is normal.     Breath sounds: Normal breath sounds.  Abdominal:     Palpations: Abdomen is soft.     Tenderness: There is no abdominal tenderness.  Musculoskeletal:     Right shoulder: No tenderness. Normal range of motion.     Cervical back: Neck supple. Muscular tenderness (mild) present. No spinous process tenderness.  Skin:    General: Skin is warm and dry.  Neurological:     Mental Status: He is alert.  Psychiatric:        Mood and Affect: Mood is not anxious.     ED Results / Procedures / Treatments   Labs (all labs ordered are listed, but only abnormal results are displayed) Labs Reviewed - No data to display  EKG None  Radiology DG Shoulder Right  Result Date: 03/02/2020 CLINICAL  DATA:  Right shoulder pain following heavy exertion, initial encounter EXAM: RIGHT SHOULDER - 2+ VIEW COMPARISON:  None. FINDINGS: There is no evidence of fracture or dislocation. There is no evidence of arthropathy or other focal bone abnormality. Soft tissues are unremarkable. IMPRESSION: No acute abnormality noted. Electronically Signed   By: Alcide Clever M.D.   On: 03/02/2020 09:20    Procedures Procedures (including critical care time)  Medications Ordered in ED Medications - No data to display  ED Course  I have reviewed the triage vital signs and the nursing notes.  Pertinent labs & imaging results that were available during my care of the patient were reviewed by me and considered in my medical decision making (see chart for details).    MDM Rules/Calculators/A&P                          No significant tenderness now.  Sounds like he had some mild trapezius pain and possibly spasm.  He feels well enough now that he does not want a further treatments or care and would like to go home.  Discussed continue to use NSAIDs and heat.  Does not have any significant radicular pain down his arm or weakness to suggest cervical radiculopathy.  Transiently had numbness but not present now.  X-rays obtained from triage are benign and viewed by myself.  Discharged home with return precautions. Final Clinical Impression(s) / ED Diagnoses Final diagnoses:  Trapezius muscle spasm    Rx / DC Orders ED Discharge Orders    None       Pricilla Loveless, MD 03/02/20 1128

## 2020-03-02 NOTE — ED Triage Notes (Signed)
Patient c/o right shoulder pain. Patient states the only physical activity that he did yesterday was use a push mower. Patient states the pain is relieved briefly with heat.

## 2020-12-01 IMAGING — CT CT ABD-PELV W/ CM
2 of 4 series · 16 of 46 positions shown, 18 images · IV contrast (omnipaque)
Comparison: None available.

CLINICAL DATA: Initial evaluation for acute right lower quadrant
abdominal pain.

EXAM:
CT ABDOMEN AND PELVIS WITH CONTRAST
TECHNIQUE: Multidetector CT imaging of the abdomen and pelvis was performed
using the standard protocol following bolus administration of
intravenous contrast.
CONTRAST:  100mL OMNIPAQUE IOHEXOL 300 MG/ML  SOLN

[Series 2: axial st · axial · 0.88mm/px · z∈[+1143,+1623]mm · 13 of 108 slices shown, 15 images]
[im 6/108  soft-tissue]
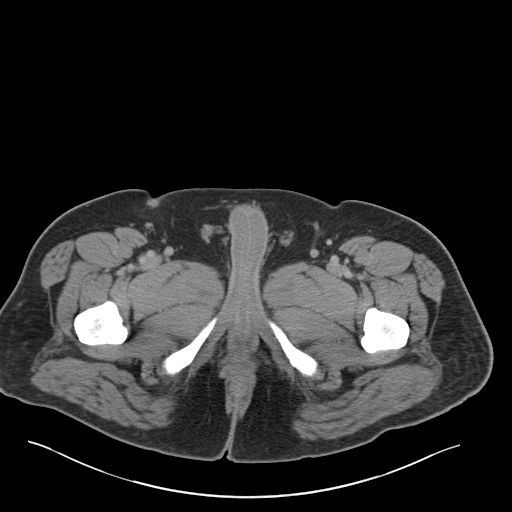
[im 6/108  bone]
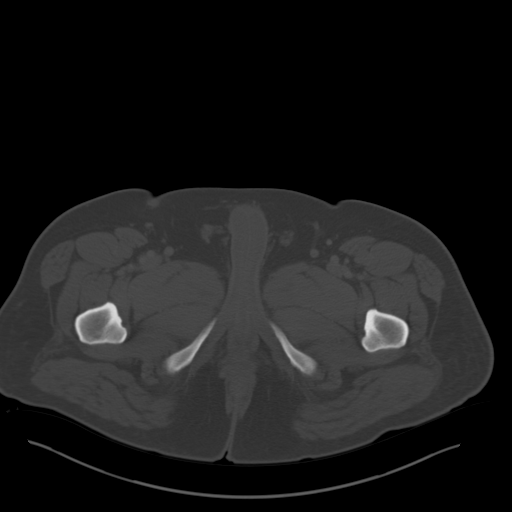
[im 12/108  soft-tissue]
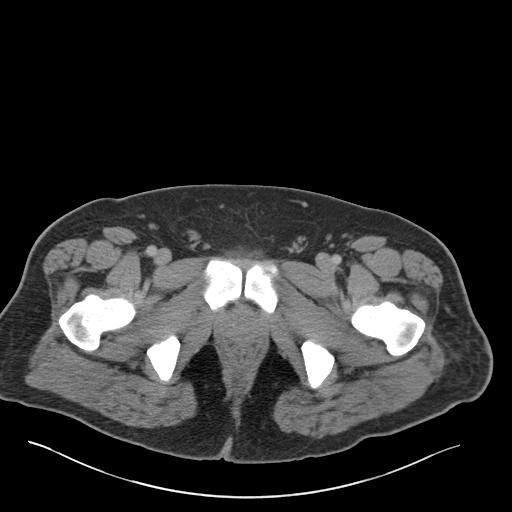
[im 24/108  soft-tissue]
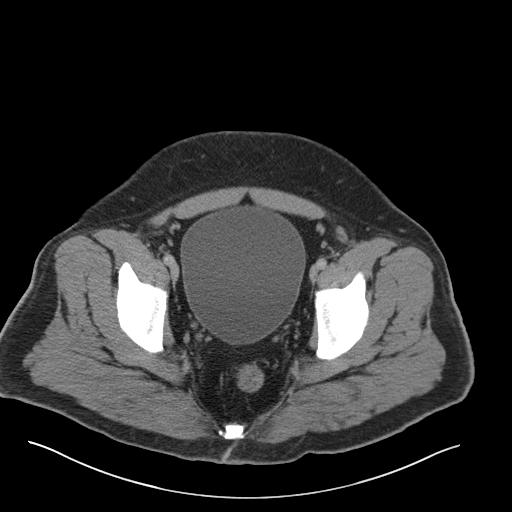
[im 30/108  soft-tissue]
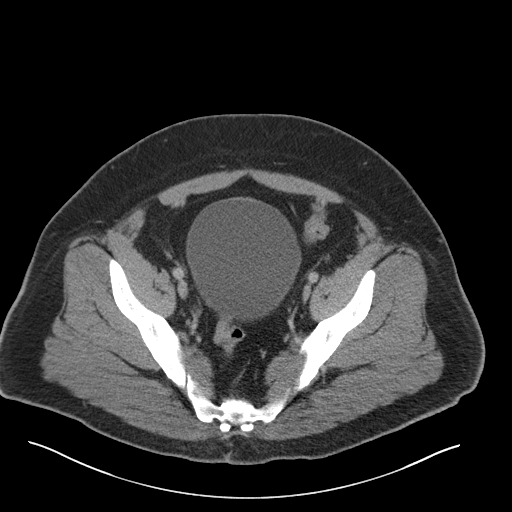
[im 36/108  soft-tissue]
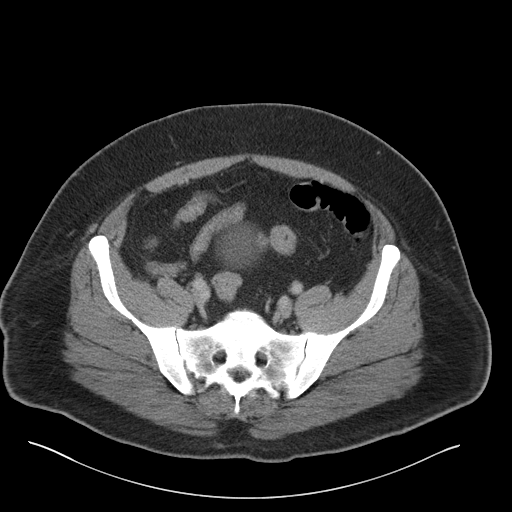
[im 48/108  soft-tissue]
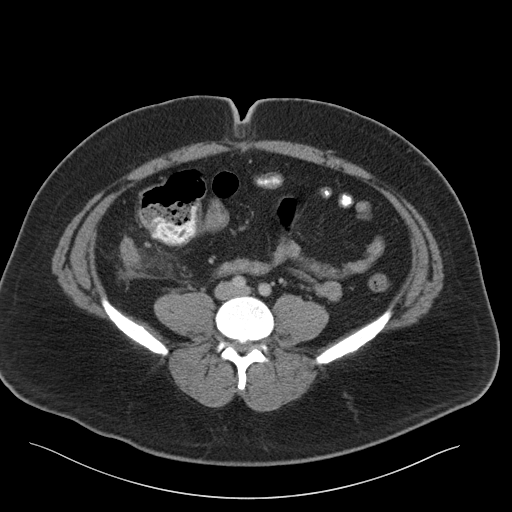
[im 54/108  soft-tissue]
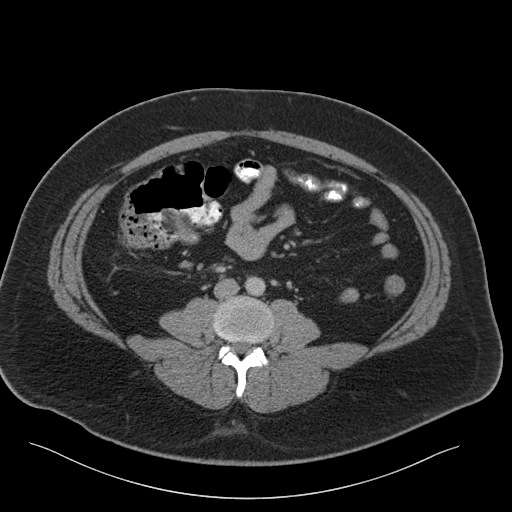
[im 60/108  soft-tissue]
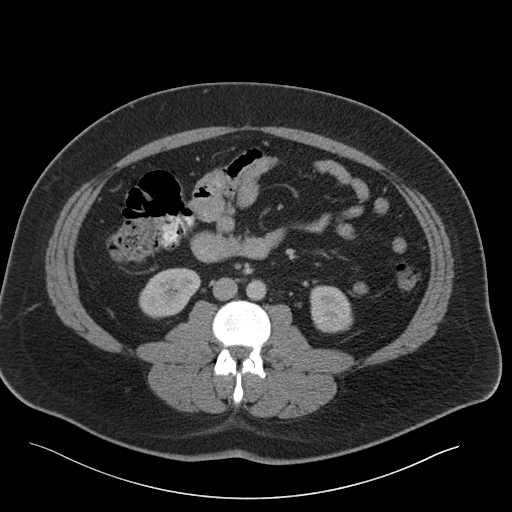
[im 72/108  soft-tissue]
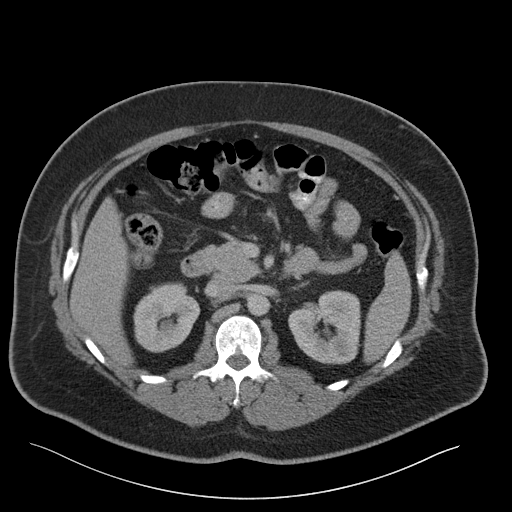
[im 72/108  bone]
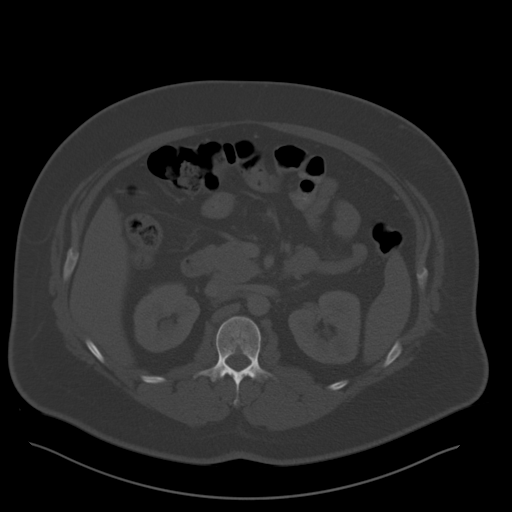
[im 78/108  soft-tissue]
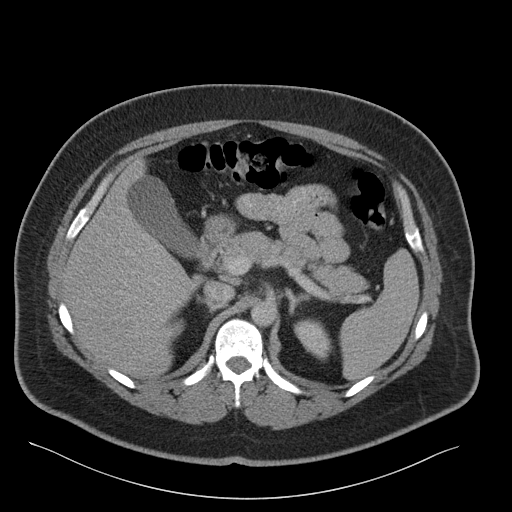
[im 84/108  soft-tissue]
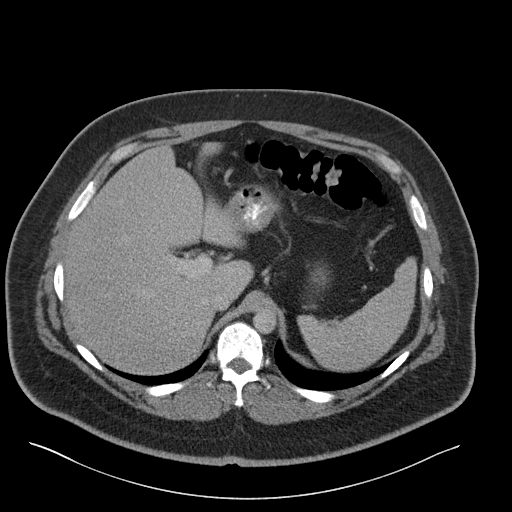
[im 96/108  soft-tissue]
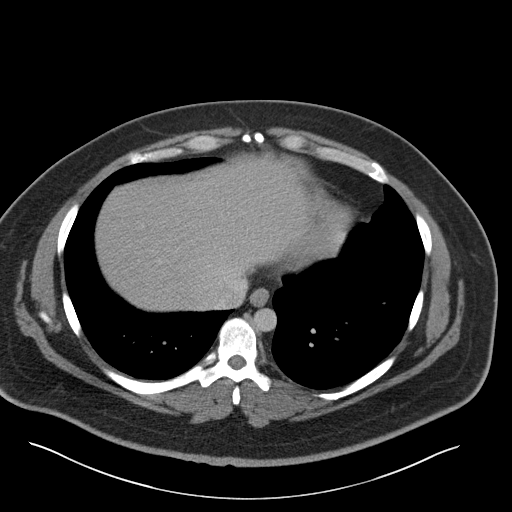
[im 102/108  soft-tissue]
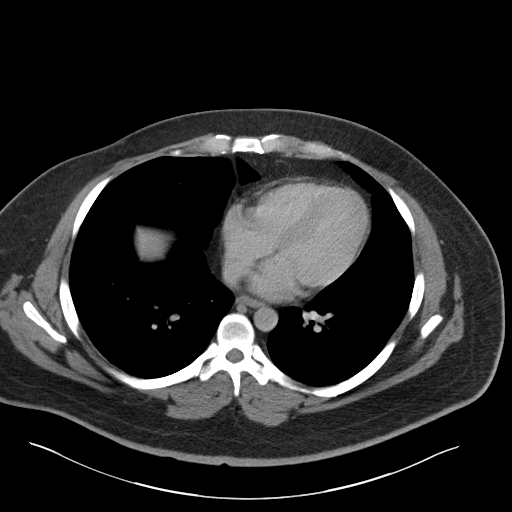

[Series 5: coronal st · coronal · 0.88mm/px · 3 of 165 slices shown]
[im 55/165  soft-tissue]
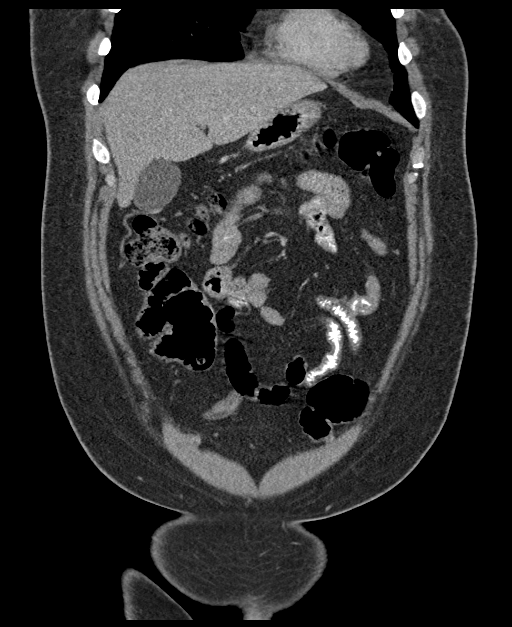
[im 73/165  soft-tissue]
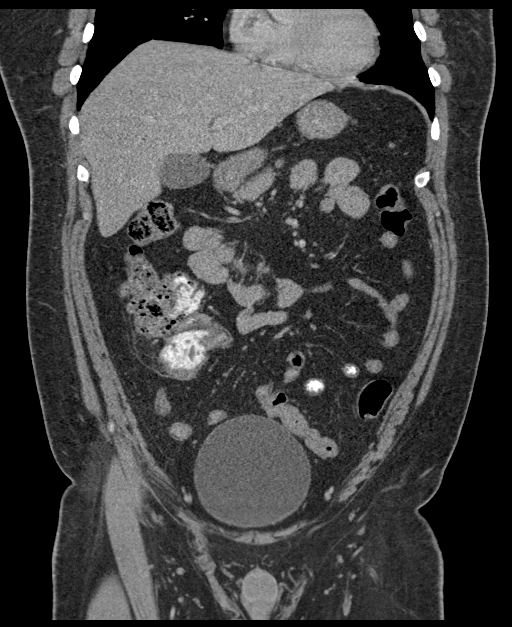
[im 92/165  soft-tissue]
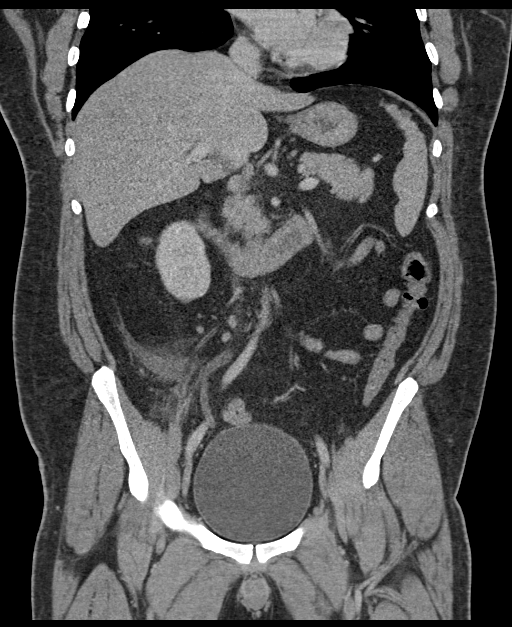

[16 of 46 positions shown; findings below may reference images not displayed]

FINDINGS: Lower chest: Visualized lung bases are clear.

Hepatobiliary: Liver demonstrates a normal contrast enhanced
appearance. Gallbladder within normal limits. No biliary dilatation.

Pancreas: Pancreas within normal limits.

Spleen: Spleen within normal limits.

Adrenals/Urinary Tract: Adrenal glands are normal. Kidneys equal in
size with symmetric enhancement. 4 mm nonobstructive stone present
at the lower pole of the left kidney. No other radiopaque calculi.
No hydronephrosis or focal enhancing renal mass. No hydroureter.
Bladder within normal limits.

Stomach/Bowel: Stomach within normal limits. No evidence for bowel
obstruction.

Appendix: Location: Appendix retrocecal in location, position within
the right lower quadrant (series 2, image 65).

Diameter: 15 mm

Appendicolith: Negative.

Mucosal hyper-enhancement: Prominent mucosal enhancement.

Extraluminal gas: No extraluminal gas to suggest perforation.

Periappendiceal collection: Prominent periappendiceal fat
stranding/phlegmonous change. No discrete periappendiceal abscess or
other collection.

Vascular/Lymphatic: Normal intravascular enhancement seen throughout
the intra-abdominal aorta. Mesenteric vessels patent proximally. No
adenopathy.

Reproductive: Prostate within normal limits.

Other: No free air or fluid. Small fat containing paraumbilical
hernia noted without associated inflammation.

Musculoskeletal: No acute osseous abnormality. No discrete lytic or
blastic osseous lesions.
IMPRESSION: 1. Findings consistent with acute appendicitis. No evidence for
perforation or other complication.
2. 4 mm nonobstructive left renal nephrolithiasis.

## 2021-10-29 IMAGING — CR DG SHOULDER 2+V*R*
3 series · 3 of 3 positions shown · non-contrast
Comparison: None.

CLINICAL DATA: Right shoulder pain following heavy exertion,
initial encounter

EXAM:
RIGHT SHOULDER - 2+ VIEW

[w shoulder external right]
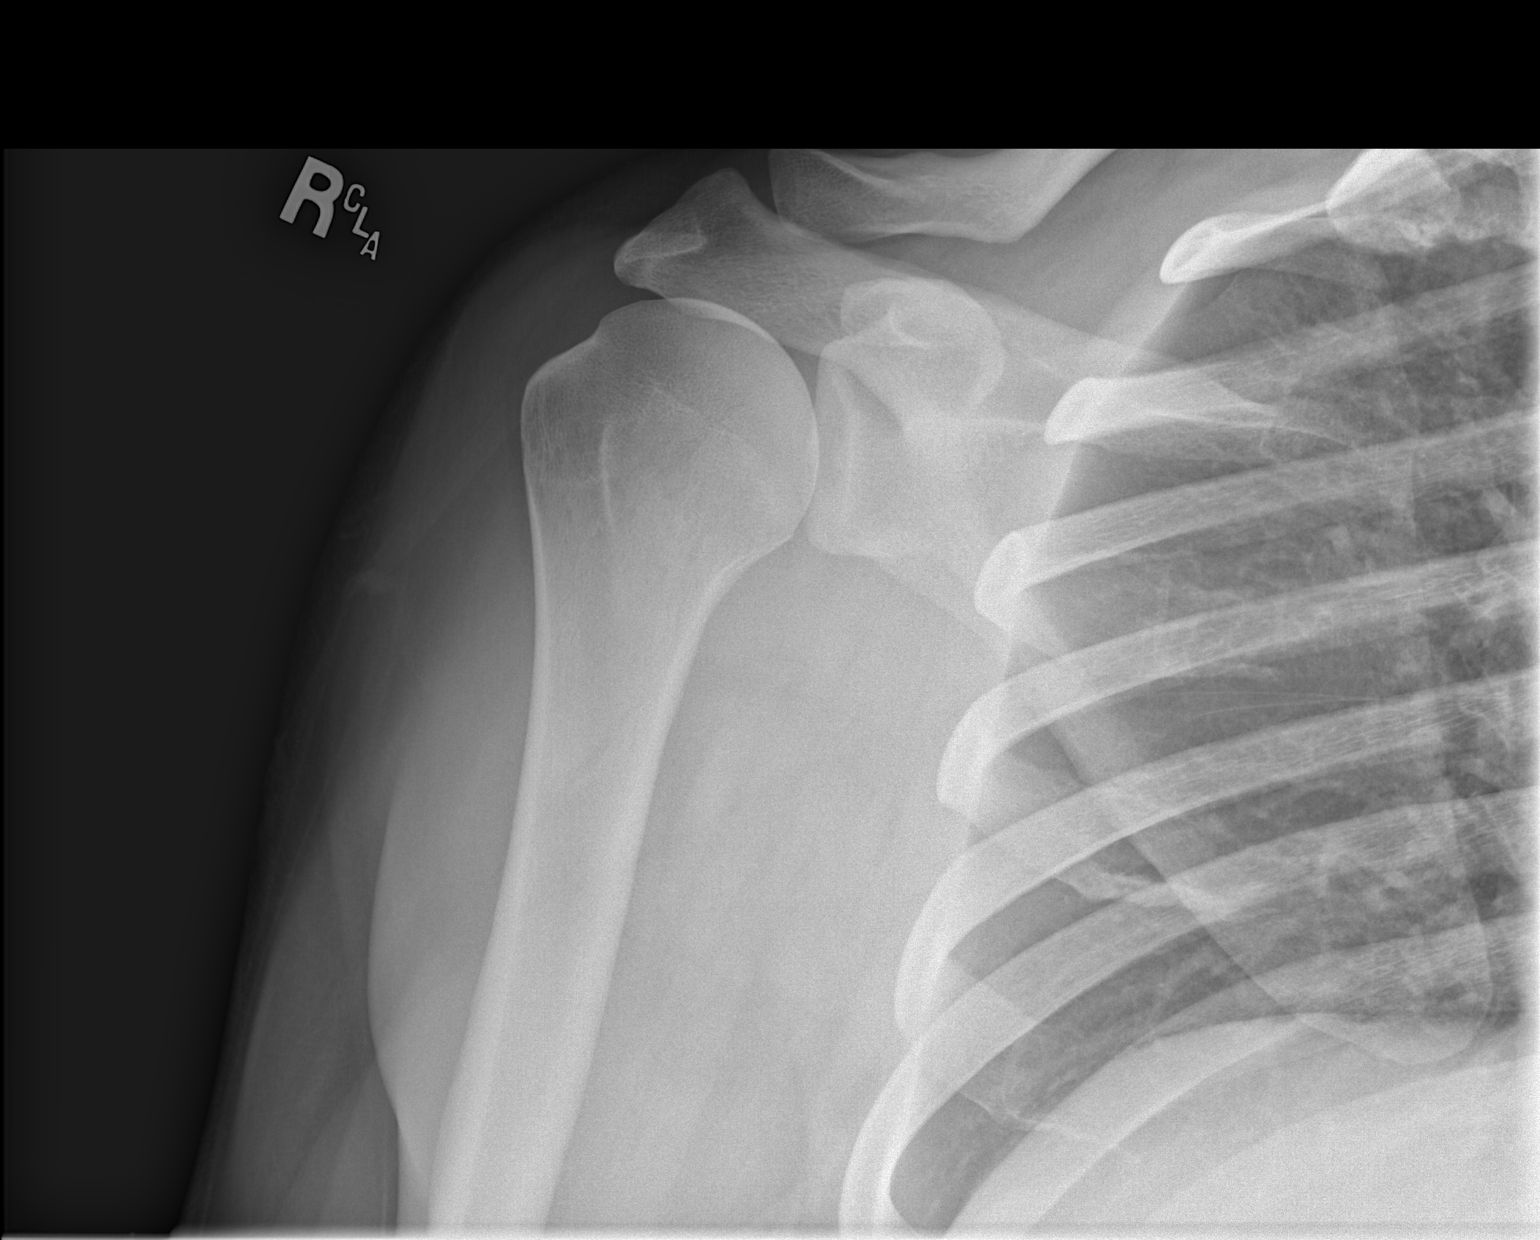

[w shoulder y-view right]
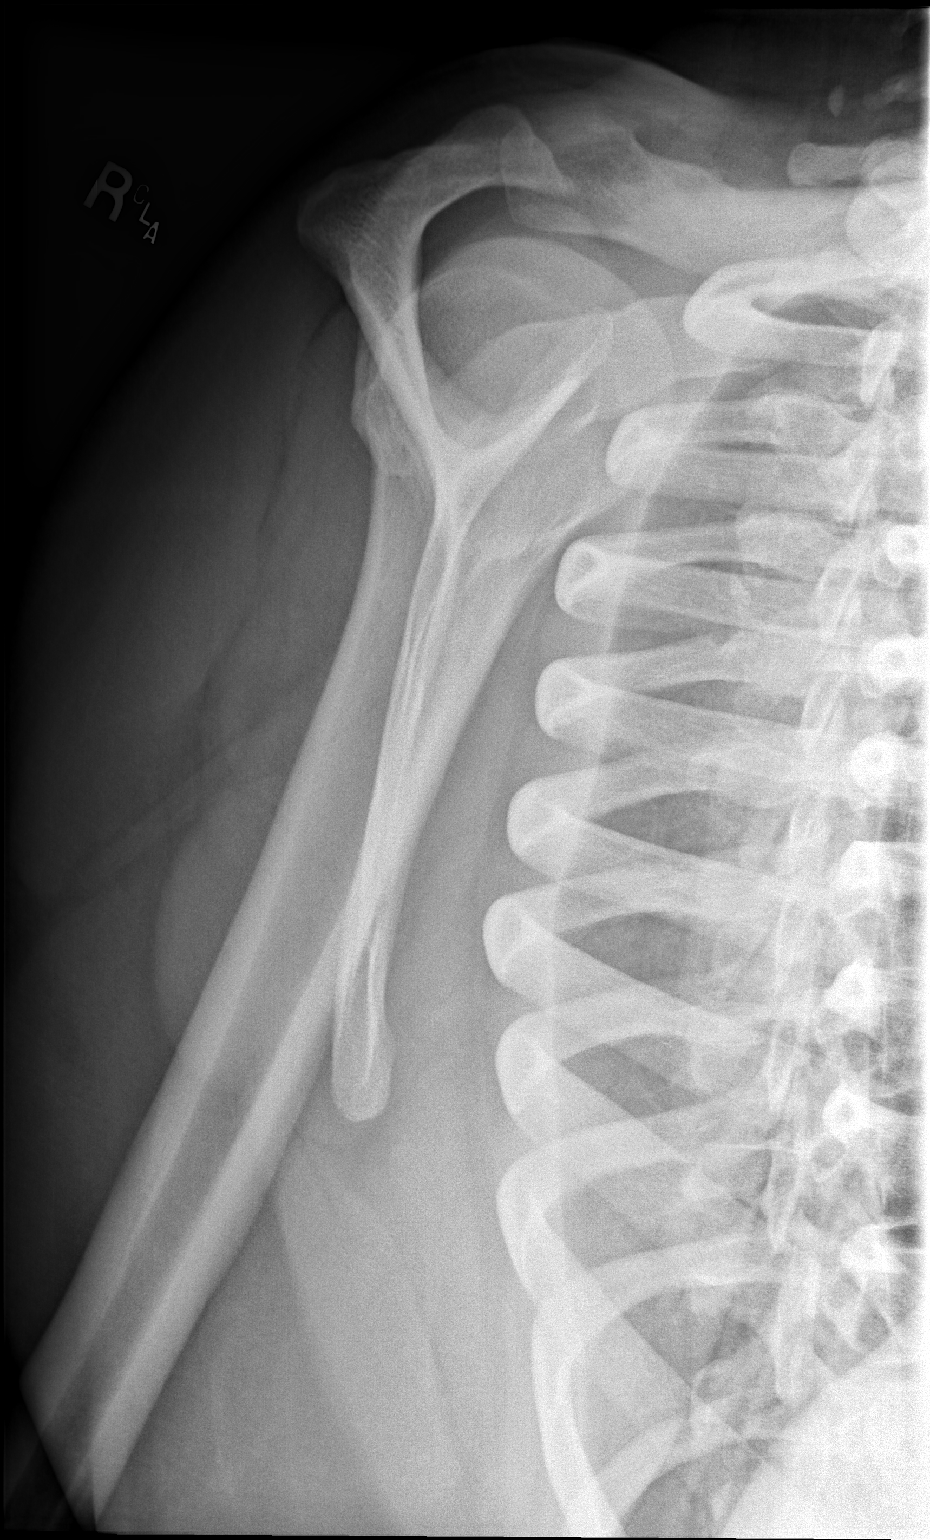

[x shoulder axillary right]
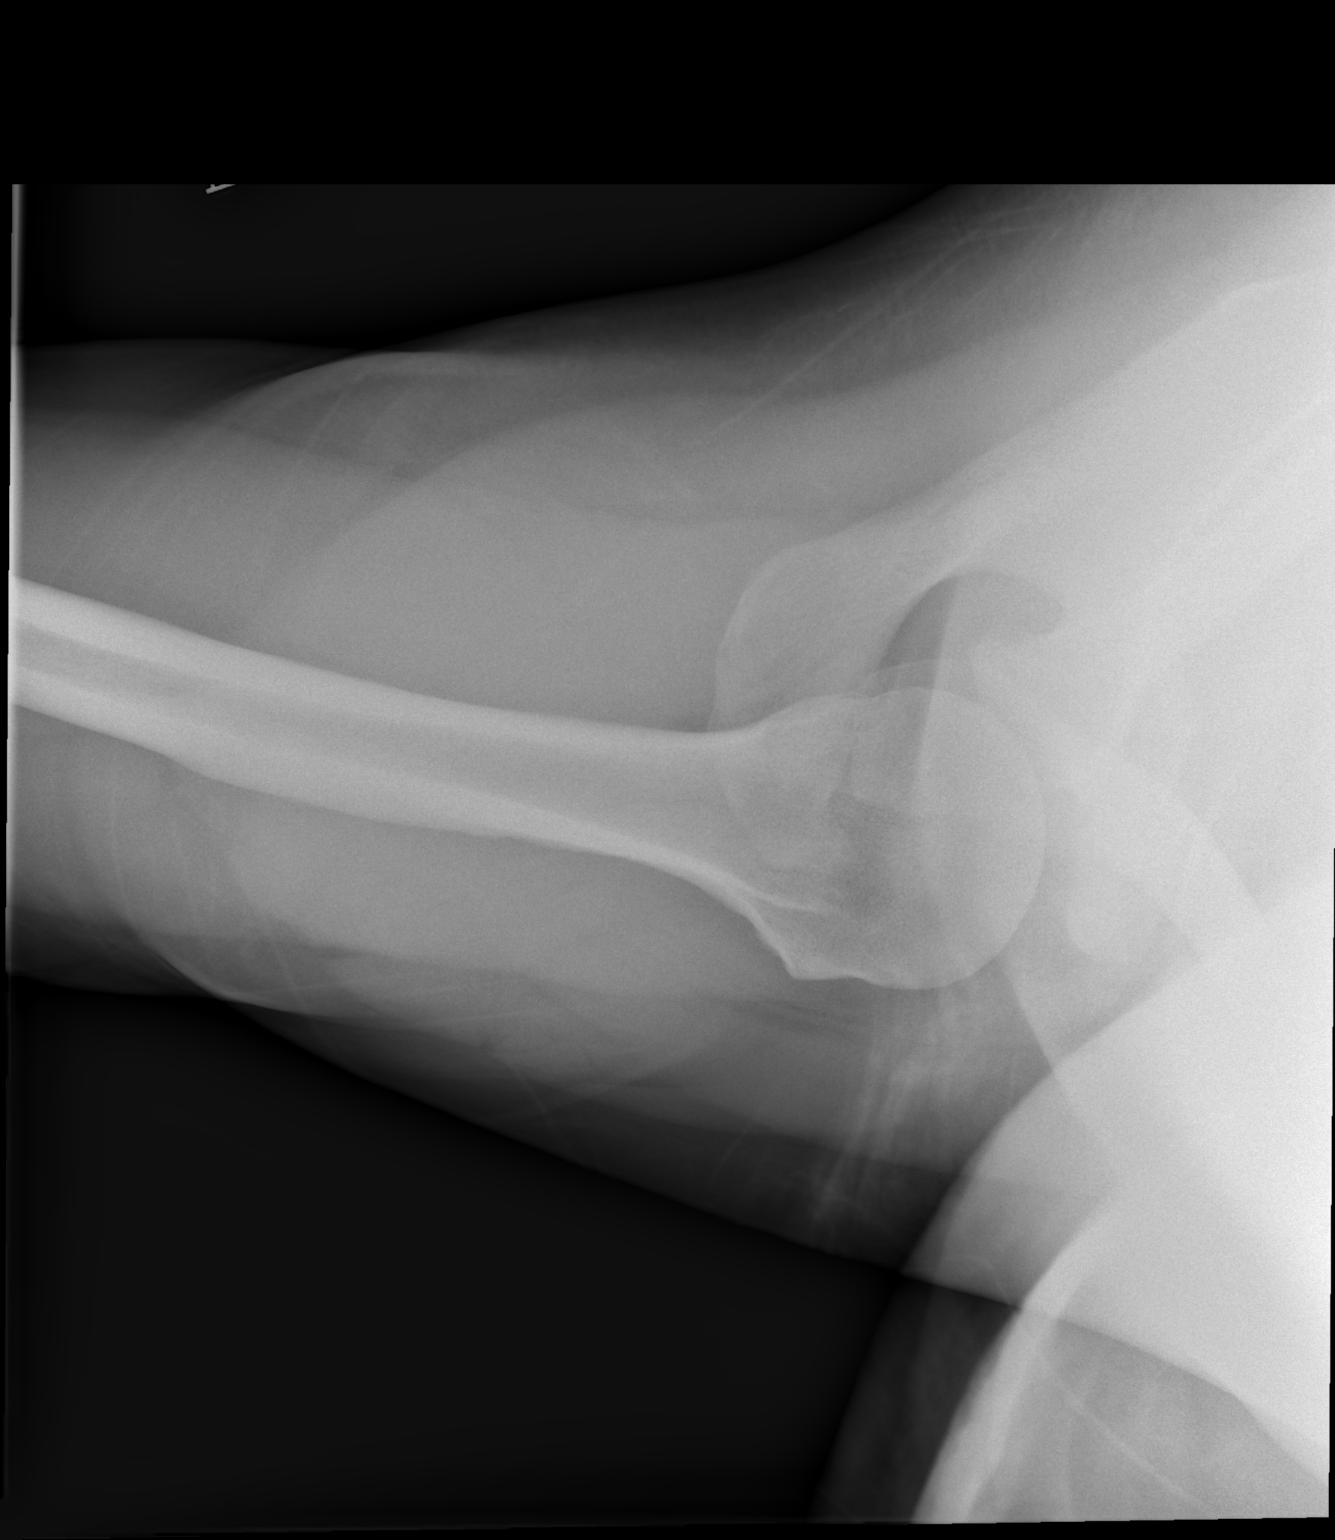

[3 of 3 positions shown; findings below may reference images not displayed]

FINDINGS: There is no evidence of fracture or dislocation. There is no
evidence of arthropathy or other focal bone abnormality. Soft
tissues are unremarkable.
IMPRESSION: No acute abnormality noted.

## 2022-08-23 ENCOUNTER — Ambulatory Visit (INDEPENDENT_AMBULATORY_CARE_PROVIDER_SITE_OTHER): Payer: PRIVATE HEALTH INSURANCE | Admitting: Sports Medicine

## 2022-08-23 VITALS — BP 120/80 | HR 79 | Ht 72.0 in | Wt 263.0 lb

## 2022-08-23 DIAGNOSIS — M7711 Lateral epicondylitis, right elbow: Secondary | ICD-10-CM

## 2022-08-23 MED ORDER — MELOXICAM 15 MG PO TABS: 15.0000 mg | ORAL_TABLET | Freq: Every day | ORAL | 0 refills | Status: AC

## 2022-08-23 NOTE — Patient Instructions (Signed)
Good to see you  - Start meloxicam 15 mg daily x2 weeks.  If still having pain after 2 weeks, complete 3rd-week of meloxicam. May use remaining meloxicam as needed once daily for pain control.  Do not to use additional NSAIDs while taking meloxicam.  May use Tylenol [phone] mg 2 to 3 times a day for breakthrough pain. Recommend getting a wrist brace  Wrist HEP  As needed follow up

## 2022-08-23 NOTE — Progress Notes (Signed)
Benito Mccreedy D.[place] Phone: [phone]   Assessment and Plan:     1. Right lateral epicondylitis -Acute, uncomplicated, initial sports medicine visit - Consistent with right lateral epicondylitis based on HPI, physical exam - No red flag symptoms, so no imaging at today's visit - Start meloxicam 15 mg daily x2 weeks.  If still having pain after 2 weeks, complete 3rd-week of meloxicam. May use remaining meloxicam as needed once daily for pain control.  Do not to use additional NSAIDs while taking meloxicam.  May use Tylenol [phone] mg 2 to 3 times a day for breakthrough pain. - Start HEP for lateral epicondylitis/tennis elbow - Recommend getting over-the-counter wrist brace to prevent wrist extension to use over the next 2 to 3 weeks  Other orders - meloxicam (MOBIC) 15 MG tablet; Take 1 tablet (15 mg total) by mouth daily.    Pertinent previous records reviewed include none   Follow Up: As needed if no improvement or worsening of symptoms in 3 to 4 weeks.  Could consider x-ray of elbow versus ultrasound versus PT versus ECSWT versus PRP   Subjective:   I, Moenique Parris, am serving as a Education administrator for Doctor Glennon Mac  Chief Complaint: right forearm pain   HPI:   08/23/22 Patient is a 42 year old male complaining of right forearm pain. Patient states that he has pain when he makes a fist, been going on for a couple of weeks, used heating a pad and massage but didn't make it go away, no meds for the pain , no numbness or tingling,  no MOIi, started weightlifting states he started light , was doing 30 reps fast with logs and 15 lb weights,   Relevant Historical Information: None  Additional pertinent review of systems negative.   Current Outpatient Medications:    meloxicam (MOBIC) 15 MG tablet, Take 1 tablet (15 mg total) by mouth daily., Disp: 30 tablet, Rfl: 0   buPROPion (WELLBUTRIN XL)  150 MG 24 hr tablet, Take 150 mg by mouth daily. , Disp: , Rfl:    buPROPion (WELLBUTRIN XL) 300 MG 24 hr tablet, Take 300 mg by mouth daily. , Disp: , Rfl:    traMADol (ULTRAM) 50 MG tablet, Take 1-2 tablets (50-100 mg total) by mouth every 6 (six) hours as needed., Disp: 15 tablet, Rfl: 0   Objective:     Vitals:   08/23/22 1354  BP: 120/80  Pulse: 79  SpO2: 98%  Weight: 263 lb (119.3 kg)  Height: 6' (1.829 m)      Body mass index is 35.67 kg/m.    Physical Exam:    General: Appears well, no acute distress, nontoxic and pleasant Neck: FROM, no pain Neuro: sensation is intact distally with no deficits, strenghth is 5/5 in elbow flexors/extenders/supinator/pronators and wrist flexors/extensors Psych: no evidence of anxiety or depression  Right ELBOW: no deformity, swelling or muscle wasting Normal Carrying angle ROM:0-140, supination and pronation 90 NTTP over triceps, ticeps tendon, olecronon, lat epicondyle, medial epicondyle, antecubital fossa, biceps tendon, supinator, pronator Negative tinnels over cubital tunnel pain with resisted wrist and middle digit extension No pain with resisted wrist flexion pain with resisted supination No pain with resisted pronation Negative valgus stress Negative varus stress Negative milking maneuver    Electronically signed by:  Benito Mccreedy D.[facility] 2:14 PM 08/23/22
